# Patient Record
Sex: Female | Born: 1975 | Race: White | Hispanic: No | Marital: Married | Smoking: Never smoker
Health system: Southern US, Community
[De-identification: ages and names within clinical notes are randomized; demographics above are authoritative.]

## PROBLEM LIST (undated history)

## (undated) DIAGNOSIS — N809 Endometriosis, unspecified: Secondary | ICD-10-CM

## (undated) DIAGNOSIS — Z87442 Personal history of urinary calculi: Secondary | ICD-10-CM

## (undated) HISTORY — PX: WISDOM TOOTH EXTRACTION: SHX21

## (undated) HISTORY — PX: OTHER SURGICAL HISTORY: SHX169

## (undated) HISTORY — PX: EYE SURGERY: SHX253

---

## 2007-05-26 ENCOUNTER — Ambulatory Visit (HOSPITAL_COMMUNITY): Admission: RE | Admit: 2007-05-26 | Discharge: 2007-05-26 | Payer: Self-pay | Admitting: Obstetrics and Gynecology

## 2007-08-28 HISTORY — PX: DIAGNOSTIC LAPAROSCOPY: SUR761

## 2007-11-12 ENCOUNTER — Ambulatory Visit (HOSPITAL_COMMUNITY): Admission: RE | Admit: 2007-11-12 | Discharge: 2007-11-12 | Payer: Self-pay | Admitting: Obstetrics and Gynecology

## 2008-11-24 ENCOUNTER — Observation Stay (HOSPITAL_COMMUNITY): Admission: AD | Admit: 2008-11-24 | Discharge: 2008-11-25 | Payer: Self-pay | Admitting: Obstetrics & Gynecology

## 2008-11-25 ENCOUNTER — Encounter (INDEPENDENT_AMBULATORY_CARE_PROVIDER_SITE_OTHER): Payer: Self-pay | Admitting: Obstetrics & Gynecology

## 2009-03-29 ENCOUNTER — Inpatient Hospital Stay (HOSPITAL_COMMUNITY): Admission: AD | Admit: 2009-03-29 | Discharge: 2009-03-31 | Payer: Self-pay | Admitting: Obstetrics and Gynecology

## 2009-06-30 ENCOUNTER — Ambulatory Visit (HOSPITAL_COMMUNITY): Admission: RE | Admit: 2009-06-30 | Discharge: 2009-06-30 | Payer: Self-pay | Admitting: Urology

## 2009-07-28 ENCOUNTER — Ambulatory Visit (HOSPITAL_COMMUNITY): Admission: RE | Admit: 2009-07-28 | Discharge: 2009-07-28 | Payer: Self-pay | Admitting: Urology

## 2010-12-02 LAB — CBC
HCT: 29.8 % — ABNORMAL LOW (ref 36.0–46.0)
MCHC: 35.5 g/dL (ref 30.0–36.0)
MCV: 98.5 fL (ref 78.0–100.0)
Platelets: 142 10*3/uL — ABNORMAL LOW (ref 150–400)
Platelets: 154 10*3/uL (ref 150–400)
RBC: 3.24 MIL/uL — ABNORMAL LOW (ref 3.87–5.11)
RDW: 12.8 % (ref 11.5–15.5)

## 2010-12-02 LAB — RPR: RPR Ser Ql: NONREACTIVE

## 2010-12-06 LAB — STONE ANALYSIS

## 2010-12-07 LAB — DIFFERENTIAL
Lymphocytes Relative: 9 % — ABNORMAL LOW (ref 12–46)
Lymphs Abs: 1 10*3/uL (ref 0.7–4.0)
Monocytes Relative: 3 % (ref 3–12)
Neutro Abs: 9.6 10*3/uL — ABNORMAL HIGH (ref 1.7–7.7)
Neutrophils Relative %: 88 % — ABNORMAL HIGH (ref 43–77)

## 2010-12-07 LAB — COMPREHENSIVE METABOLIC PANEL
BUN: 9 mg/dL (ref 6–23)
Calcium: 9.2 mg/dL (ref 8.4–10.5)
Creatinine, Ser: 0.84 mg/dL (ref 0.4–1.2)
Glucose, Bld: 138 mg/dL — ABNORMAL HIGH (ref 70–99)
Sodium: 138 mEq/L (ref 135–145)
Total Protein: 6 g/dL (ref 6.0–8.3)

## 2010-12-07 LAB — CBC
HCT: 35.2 % — ABNORMAL LOW (ref 36.0–46.0)
Hemoglobin: 11.9 g/dL — ABNORMAL LOW (ref 12.0–15.0)
MCHC: 33.8 g/dL (ref 30.0–36.0)
RDW: 12.9 % (ref 11.5–15.5)

## 2011-01-09 NOTE — Discharge Summary (Signed)
[name], [name]               ACCOUNT NO.:  [id number]   MEDICAL RECORD NO.:  [id number]          PATIENT TYPE:  INP   LOCATION:  9310                          FACILITY:  [facility]   PHYSICIAN:  Maxie Better, M.D.DATE OF BIRTH:  [birth year]   DATE OF ADMISSION:  11/24/2008  DATE OF DISCHARGE:                               DISCHARGE SUMMARY   The patient is a 35 year old gravida 2, para 0 at 26 weeks' gestation  with an EDC of April 15, 2009.  The patient has received prenatal care  at [facility] since 9 weeks' gestation with a primary, Dr. Billy Coast.  Prenatal care has been uncomplicated.  Medical and surgical history are  negative.  The patient has no known drug allergies and takes a prenatal  vitamin once daily.  The patient presents with acute left flank pain  today with nausea and vomiting.  She had no urinary tract infection  symptoms.  No dysuria, no frequency, no urgency, no bleeding.   On presentation, her vital signs were within normal limits with a  temperature of 97.5, pulse 58, respirations 18, a blood pressure of  111/67.  Urinalysis reveals positive hemoglobin.  Urine culture is  pending from [facility] office.  CBC had a white blood cell count  of 11.1, hemoglobin of 11.5, hematocrit of 35.2, and a platelet count of  162.  CMET is within normal limits except for a glucose of 138.  The  patient has no history of diabetes.  We will follow up with fasting  blood sugar during hospitalization.  Sonogram reveals a normal right  kidney, left kidney with a 6 x 8 x 8 average-to-2-mm calculi in the  upper pole.  No evidence of obstruction.  The patient has left CVA  tenderness.  No vaginal bleeding, active fetal movement.  The patient is  admitted for IV hydration and pain management.  The patient received IV  fluid with aggressive hydration with lactated Ringer's.  PCA Dilaudid  for pain management which was effective during hospitalization.  Treated  for nausea x1  with Zofran and Urology Nephrology consult.  On November 25, 2008, in early morning around 1:00 a.m., the patient passed a small  stone-appearing material that was drained from her urine and sent to  Pathology.  Report is pending.  Pain is resolved subsequently after that  episode.  Consult with Urology, Dr. Orvan Falconer.  At this point, the  patient needs no inpatient or office consult due to current status and  pain has resolved.  Larger stone identified on sonogram.  Will need  followup postpartum unless she has any acute recurrence of symptoms.  Expected management at this point through her OB/GYN office and monitor  for pyelectasis in third trimester.  The patient is discharged home on  November 25, 2008, in stable improved condition.  Vital signs are within  normal limits.  The patient has remained afebrile.  Temperature is 98.7,  pulse is 73, respirations 20, blood pressure is 93/62.  Followup glucose  reading of a fasting blood sugar at 86 within normal limits and her pain  is resolved.   Discharge meds include a prenatal vitamin once daily.  Patient cautioned  not to take any additional calcium supplements, Tylox 1-2 p.o. every 4  hours as needed for pain, take only when needed.  The patient to  maintain adequate fluid hydration throughout entire pregnancy.  She has  no activity restrictions.  May return to her normal activity as  tolerated and to follow up at [facility] as scheduled for her next  routine prenatal care visits.  The patient is to call the office with  any acute return of pain, any fever, or urinary tract symptoms.  We will  contact the patient regarding urine culture results from the office when  resulted.  The patient was admitted on November 24, 2008.   ATTENDING PHYSICIAN:  Maxie Better, MD   DIAGNOSES:  19 weeks' gestation, left renal calculi, pain resolved.      Marlinda Mike, C.N.M.      Maxie Better, M.D.  Electronically Signed    TB/MEDQ   D:  11/25/2008  T:  11/25/2008  Job:  [id number]

## 2011-01-09 NOTE — H&P (Signed)
NAMELACRETIA, TINDALL               ACCOUNT NO.:  [id number]   MEDICAL RECORD NO.:  [id number]          PATIENT TYPE:  INP   LOCATION:  9101                          [facility]ACILITY:  [facility]   PHYSICIAN:  Lenoard Aden, M.D.DATE OF BIRTH:  1976/08/04   DATE OF ADMISSION:  03/29/2009  DATE OF DISCHARGE:                              HISTORY & PHYSICAL   CHIEF COMPLAINT:  Labor.   HISTORY OF PRESENT ILLNESS:  She is a 35 year old white female G2, P0 at  [redacted] weeks gestation presents to the office 5 cm dilated in active labor.  She has no known drug allergies.  Her medications are prenatal vitamins.  She is a nonsmoker, nondrinker.  She denies domestic or physical  violence.  She had a prenatal course complicated by LGA and GBS  positivity.  She has family history of hypertension, kidney stones, and  diabetes.  Pregnancy course also was complicated by hospitalization for  urolithiasis.   PHYSICAL EXAMINATION:  GENERAL:  She is a well-developed, well-nourished  white female in no acute distress.  HEENT:  Normal.  LUNGS:  Clear.  HEART:  Regular rate and rhythm.  ABDOMEN:  Soft, gravid, nontender.  Estimated fetal weight 8 pounds.  Cervix 5, 80% vertex, 0 station.  EXTREMITIES:  There are no cords.  NEUROLOGIC:  Nonfocal.  SKIN:  Intact.   IMPRESSION:  Term intrauterine pregnancy and active labor.  GBS  positive.   PLAN:  Ampicillin prophylaxis, epidural.  Anticipate attempts at vaginal  delivery.      Lenoard Aden, M.D.  Electronically Signed     RJT/MEDQ  D:  03/29/2009  T:  03/30/2009  Job:  [id number]

## 2011-01-09 NOTE — Op Note (Signed)
[name] [name], [name] [name]               ACCOUNT NO.:  [id number]   MEDICAL RECORD NO.:  [id number]          PATIENT TYPE:  AMB   LOCATION:  SDC                           [facility]ILITY:  [facility]   PHYSICIAN:  Lenoard Aden, M.D.DATE OF BIRTH:  Sep 12, 1975   DATE OF PROCEDURE:  11/12/2007  DATE OF DISCHARGE:                               OPERATIVE REPORT   PREOPERATIVE DIAGNOSES:  1. Pelvic pain.  2. Dysmenorrhea.   POSTOPERATIVE DIAGNOSES:  1. Pelvic adhesions.  2. Pelvic endometriosis.  3. Left ovarian endometrioma.   PROCEDURES:  1. Diagnostic laparoscopy.  2. Lysis of adhesions.  3. Ablation of pelvic endometriosis.  4. Excision of left ovarian endometrioma.   SURGEON:  Lenoard Aden, M.D.   ANESTHESIA:  General.   ESTIMATED BLOOD LOSS:  Less than 50 mL.   COMPLICATIONS:  None.   DRAINS:  None.   COUNTS:  Correct.   Patient to recovery in good condition.   DESCRIPTION OF PROCEDURE:  After being apprised of the risks of  anesthesia, infection, bleeding, injury to abdominal organs with need  for repair, delayed versus immediate complications to include bowel and  bladder injury with inability to cure pelvic pain, the patient brought  to the operating room where she was administered a general anesthetic  without complications, prepped and draped in usual sterile fashion,  catheterized until the bladder was empty.  Hulka tenaculum placed per  vagina.  Infraumbilical incision made with a scalpel.  Feet having been  placed in the yellow fin stirrups.  Dilute Marcaine solution placed in  the umbilicus. After achieving a small stab incision, a Veress needle is  placed, opening pressure -2 noted, 4 liters CO2 insufflated without  difficulty.  Trocar placed atraumatically.  Pictures taken.  Normal  liver and gallbladder bed noted.  Normal appendiceal area.  There are  some adhesions from the sigmoid colon to the left pelvic sidewall.  There are adhesions in the cul-de-sac  specifically related to the left  ovary into the ovarian fossa.  Multiple implants of endometriosis noted  posteriorly and along the left and right pelvic sidewall.  Two 5 mm  trocar sites are placed atraumatically under direct visualization and  the Kleppinger bipolar cautery is entered ablating all evidence of  endometriosis along the left and right pelvic side walls and in the  posterior cul-de-sac, attention having been paid to the location of the  ureter during this process.  At this time, the right ovary is elevated  using atraumatic grasper and sharply excised from the ovarian fossa  whereby an endometrioma is entered that is adhering this lifted ovary to  the left pelvic sidewall.  This endometrioma is excised.  Ovary is  opened.  Endometrioma is drained.  The cyst wall and is cauterized and  excised.  The ovary is then elevated and the defect from where the left  endometrioma is noted appears to be hemostatic.  There are multiple  superficial implants of endometriosis along the left ovary which are  also ablated without difficulty.  Minimal bleeding is noted.  The pelvic  sidewall then  is noted to have some areas consistent with endometriosis  which are ablated using the Kleppinger bipolar cautery as well.  Superficial implants along the right ovary are cauterized as well.  Good  hemostasis is noted.  CO2 is released.  Irrigation is accomplished.  Replacement of the CO2 reveals good hemostasis.  All irrigation fluid is  removed under direct visualization.  CO2 is re-released.  Good  hemostasis is confirmed.  Bilateral normal tubes are noted.  Normal  anterior cul-de-sac is noted.  Previously normal HSG is noted.  At this  time, CO2 is released.  All instruments are removed under direct  visualization.  Incisions are closed using a zero Vicryl, 4-0 Vicryl and  Dermabond.  Instruments are removed from the vagina.  The patient is  awakened and transferred to recovery in good  condition.      Lenoard Aden, M.D.  Electronically Signed     RJT/MEDQ  D:  11/12/2007  T:  11/12/2007  Job:  [id number]

## 2011-05-21 LAB — CBC
Hemoglobin: 13.3
MCHC: 35.1
MCV: 92.4
RBC: 4.1

## 2014-07-09 ENCOUNTER — Other Ambulatory Visit: Payer: Self-pay | Admitting: Obstetrics and Gynecology

## 2014-07-13 NOTE — Patient Instructions (Addendum)
   Your procedure is scheduled on: Monday, Nov 23  [facility] at: 12:30 PM Pick up the phone at the desk and dial [phone] and inform us of your arrival.  Please call this number if you have any problems the morning of surgery: [phone]  Remember: Do not eat food after midnight: Sunday Do not drink clear liquids after: 9:30 AM Monday, day of surgery Take these medicines the morning of surgery with a SIP OF WATER:  None  Do not wear jewelry, make-up, or FINGER nail polish No metal in your hair or on your body. Do not wear lotions, powders, perfumes.  You may wear deodorant.  Do not bring valuables to the hospital. Contacts, dentures or bridgework may not be worn into surgery.  Leave suitcase in the car. After Surgery it may be brought to your room. For patients being admitted to the hospital, checkout time is 11:00am the day of discharge.  Home with mother [name] [name] cell [phone]

## 2014-07-15 ENCOUNTER — Encounter (HOSPITAL_COMMUNITY)
Admission: RE | Admit: 2014-07-15 | Discharge: 2014-07-15 | Disposition: A | Discharge status: Home / Self Care | Payer: Managed Care, Other (non HMO) | Source: Ambulatory Visit | Attending: Obstetrics and Gynecology | Admitting: Obstetrics and Gynecology

## 2014-07-15 ENCOUNTER — Encounter (HOSPITAL_COMMUNITY): Payer: Self-pay

## 2014-07-15 DIAGNOSIS — Z01812 Encounter for preprocedural laboratory examination: Secondary | ICD-10-CM | POA: Diagnosis present

## 2014-07-15 HISTORY — DX: Endometriosis, unspecified: N80.9

## 2014-07-15 HISTORY — DX: Personal history of urinary calculi: Z87.442

## 2014-07-15 LAB — CBC
HCT: 36 % (ref 36.0–46.0)
Hemoglobin: 12.1 g/dL (ref 12.0–15.0)
MCH: 30.6 pg (ref 26.0–34.0)
MCHC: 33.6 g/dL (ref 30.0–36.0)
MCV: 91.1 fL (ref 78.0–100.0)
PLATELETS: 198 10*3/uL (ref 150–400)
RBC: 3.95 MIL/uL (ref 3.87–5.11)
RDW: 13.5 % (ref 11.5–15.5)
WBC: 5.3 10*3/uL (ref 4.0–10.5)

## 2014-07-15 LAB — COMPREHENSIVE METABOLIC PANEL
ALK PHOS: 52 U/L (ref 39–117)
ALT: 13 U/L (ref 0–35)
AST: 15 U/L (ref 0–37)
Albumin: 3.9 g/dL (ref 3.5–5.2)
Anion gap: 10 (ref 5–15)
BUN: 17 mg/dL (ref 6–23)
CALCIUM: 9 mg/dL (ref 8.4–10.5)
CO2: 29 meq/L (ref 19–32)
Chloride: 100 mEq/L (ref 96–112)
Creatinine, Ser: 0.76 mg/dL (ref 0.50–1.10)
GFR calc Af Amer: >90 mL/min (ref >90)
Glucose, Bld: 134 mg/dL — ABNORMAL HIGH (ref 70–99)
POTASSIUM: 3.9 meq/L (ref 3.7–5.3)
SODIUM: 139 meq/L (ref 137–147)
Total Bilirubin: 1.2 mg/dL (ref 0.3–1.2)
Total Protein: 6.8 g/dL (ref 6.0–8.3)

## 2014-07-18 NOTE — H&P (Signed)
NAMRutherford Nail:  [name], [name]               ACCOUNT NO.:  [id number]  MEDICAL RECORD NO.:  [id number]  LOCATION:  PERIO                         [facility]ACILITY:  [facility]  PHYSICIAN:  Lenoard Adenichard J. Sokha Craker, M.D.DATE OF BIRTH:  [birth year]  DATE OF ADMISSION:  06/10/2014 DATE OF DISCHARGE:                             HISTORY & PHYSICAL   CHIEF COMPLAINT:  Pelvic pain, dysmenorrhea, menorrhagia with history of endometriosis.  HISTORY OF PRESENT ILLNESS:  A [age] year old white female G1, P1, presents for definitive therapy of recurrent endometriosis, pelvic pain, and dysmenorrhea.  ALLERGIES:  She has no known drug allergies.  MEDICATIONS:  Multivitamin.  FAMILY HISTORY:  Hypertension, colon cancer, diabetes, and kidney stone.  SOCIAL HISTORY:  Noncontributory.  PREGNANCY HISTORY:  One vaginal delivery and one miscarriage.  PHYSICAL EXAMINATION:  GENERAL:  A well-developed, well-nourished, white female, in no acute distress. HEENT:  Normal. NECK:  Supple.  Full range of motion. LUNGS:  Clear. HEART:  Regular rate and rhythm. ABDOMEN:  Soft, nontender. PELVIC: tender , normal sized,  anteflexed uterus and bilateral adnexal tenderness.  Ultrasound is questionably consistent with bilateral endometriomas. EXTREMITIES:  No cords. NEUROLOGIC:  Nonfocal. SKIN:  Intact.  IMPRESSION: 1. Symptomatic pelvic pain with dysmenorrhea and menorrhagia. 2. History of pelvic endometriosis.  PLAN:  Proceed with daVinci assisted total laparoscopic hysterectomy, bilateral salpingectomy,  Excision vs ablative treatment of endometriosis.  Risks of anesthesia, infection, bleeding, injury to surrounding organs with possible need for repair was discussed, delayed versus immediate complications to include bowel and bladder injury are noted.  The patient acknowledges and wishes to proceed.     Lenoard Adenichard J. Benton Tooker, M.D.     RJT/MEDQ  D:  07/18/2014  T:  07/18/2014  Job:  [id number]

## 2014-07-19 ENCOUNTER — Ambulatory Visit (HOSPITAL_COMMUNITY): Payer: Managed Care, Other (non HMO) | Admitting: Certified Registered Nurse Anesthetist

## 2014-07-19 ENCOUNTER — Encounter (HOSPITAL_COMMUNITY)
Admission: RE | Disposition: A | Discharge status: Home / Self Care | Payer: Self-pay | Source: Ambulatory Visit | Attending: Obstetrics and Gynecology

## 2014-07-19 ENCOUNTER — Observation Stay (HOSPITAL_COMMUNITY)
Admission: RE | Admit: 2014-07-19 | Discharge: 2014-07-20 | Disposition: A | Discharge status: Home / Self Care | Payer: Managed Care, Other (non HMO) | Source: Ambulatory Visit | Attending: Obstetrics and Gynecology | Admitting: Obstetrics and Gynecology

## 2014-07-19 ENCOUNTER — Encounter (HOSPITAL_COMMUNITY): Payer: Self-pay | Admitting: *Deleted

## 2014-07-19 ENCOUNTER — Ambulatory Visit (HOSPITAL_COMMUNITY): Payer: Managed Care, Other (non HMO) | Admitting: Anesthesiology

## 2014-07-19 DIAGNOSIS — N809 Endometriosis, unspecified: Secondary | ICD-10-CM | POA: Diagnosis present

## 2014-07-19 DIAGNOSIS — N736 Female pelvic peritoneal adhesions (postinfective): Secondary | ICD-10-CM | POA: Insufficient documentation

## 2014-07-19 DIAGNOSIS — N803 Endometriosis of pelvic peritoneum: Secondary | ICD-10-CM | POA: Diagnosis not present

## 2014-07-19 DIAGNOSIS — N838 Other noninflammatory disorders of ovary, fallopian tube and broad ligament: Principal | ICD-10-CM | POA: Insufficient documentation

## 2014-07-19 DIAGNOSIS — N920 Excessive and frequent menstruation with regular cycle: Secondary | ICD-10-CM | POA: Diagnosis not present

## 2014-07-19 DIAGNOSIS — N946 Dysmenorrhea, unspecified: Secondary | ICD-10-CM | POA: Diagnosis present

## 2014-07-19 HISTORY — PX: ROBOTIC ASSISTED TOTAL HYSTERECTOMY: SHX6085

## 2014-07-19 LAB — HCG, SERUM, QUALITATIVE: Preg, Serum: NEGATIVE

## 2014-07-19 SURGERY — ROBOTIC ASSISTED TOTAL HYSTERECTOMY
Anesthesia: General | Site: Abdomen | Laterality: Bilateral

## 2014-07-19 MED ORDER — HYDROMORPHONE HCL 1 MG/ML IJ SOLN: 0.2500 mg | INTRAMUSCULAR | Status: DC | PRN

## 2014-07-19 MED ORDER — SCOPOLAMINE 1 MG/3DAYS TD PT72
MEDICATED_PATCH | TRANSDERMAL | Status: AC
  Administered 2014-07-19: 1.5 mg via TRANSDERMAL
  Filled 2014-07-19: qty 1

## 2014-07-19 MED ORDER — SODIUM CHLORIDE 0.9 % IV SOLN
INTRAVENOUS | Status: DC | PRN
  Administered 2014-07-19: 76 mL

## 2014-07-19 MED ORDER — LIDOCAINE HCL (CARDIAC) 20 MG/ML IV SOLN
INTRAVENOUS | Status: DC | PRN
  Administered 2014-07-19: 80 mg via INTRAVENOUS

## 2014-07-19 MED ORDER — GLYCOPYRROLATE 0.2 MG/ML IJ SOLN
INTRAMUSCULAR | Status: DC | PRN
  Administered 2014-07-19: 0.2 mg via INTRAVENOUS
  Administered 2014-07-19: .5 mg via INTRAVENOUS

## 2014-07-19 MED ORDER — LIDOCAINE HCL (CARDIAC) 20 MG/ML IV SOLN
INTRAVENOUS | Status: AC
  Filled 2014-07-19: qty 5

## 2014-07-19 MED ORDER — METOCLOPRAMIDE HCL 5 MG/ML IJ SOLN: 10.0000 mg | Freq: Once | INTRAMUSCULAR | Status: DC | PRN

## 2014-07-19 MED ORDER — FENTANYL CITRATE 0.05 MG/ML IJ SOLN: 25.0000 ug | INTRAMUSCULAR | Status: DC | PRN

## 2014-07-19 MED ORDER — PROPOFOL 10 MG/ML IV EMUL
INTRAVENOUS | Status: AC
  Filled 2014-07-19: qty 100

## 2014-07-19 MED ORDER — DEXAMETHASONE SODIUM PHOSPHATE 10 MG/ML IJ SOLN
INTRAMUSCULAR | Status: DC | PRN
  Administered 2014-07-19: 4 mg via INTRAVENOUS

## 2014-07-19 MED ORDER — MIDAZOLAM HCL 2 MG/2ML IJ SOLN
INTRAMUSCULAR | Status: DC | PRN
  Administered 2014-07-19: 2 mg via INTRAVENOUS

## 2014-07-19 MED ORDER — ONDANSETRON HCL 4 MG/2ML IJ SOLN
INTRAMUSCULAR | Status: AC
  Filled 2014-07-19: qty 2

## 2014-07-19 MED ORDER — TRAMADOL HCL 50 MG PO TABS: 50.0000 mg | ORAL_TABLET | Freq: Four times a day (QID) | ORAL | Status: DC | PRN

## 2014-07-19 MED ORDER — ROCURONIUM BROMIDE 100 MG/10ML IV SOLN
INTRAVENOUS | Status: AC
  Filled 2014-07-19: qty 1

## 2014-07-19 MED ORDER — CEFAZOLIN SODIUM-DEXTROSE 2-3 GM-% IV SOLR
INTRAVENOUS | Status: AC
  Filled 2014-07-19: qty 50

## 2014-07-19 MED ORDER — OXYCODONE-ACETAMINOPHEN 5-325 MG PO TABS
1.0000 | ORAL_TABLET | ORAL | Status: DC | PRN
  Administered 2014-07-20: 1 via ORAL
  Filled 2014-07-19: qty 1

## 2014-07-19 MED ORDER — LACTATED RINGERS IR SOLN
Status: DC | PRN
  Administered 2014-07-19: 3000 mL

## 2014-07-19 MED ORDER — SODIUM CHLORIDE 0.9 % IJ SOLN: 9.0000 mL | INTRAMUSCULAR | Status: DC | PRN

## 2014-07-19 MED ORDER — EPHEDRINE SULFATE 50 MG/ML IJ SOLN
INTRAMUSCULAR | Status: DC | PRN
  Administered 2014-07-19 (×2): 5 mg via INTRAVENOUS

## 2014-07-19 MED ORDER — DIPHENHYDRAMINE HCL 12.5 MG/5ML PO ELIX: 12.5000 mg | ORAL_SOLUTION | Freq: Four times a day (QID) | ORAL | Status: DC | PRN

## 2014-07-19 MED ORDER — ROPIVACAINE HCL 5 MG/ML IJ SOLN
INTRAMUSCULAR | Status: AC
  Filled 2014-07-19: qty 60

## 2014-07-19 MED ORDER — HYDROMORPHONE 0.3 MG/ML IV SOLN
INTRAVENOUS | Status: DC
  Administered 2014-07-19: 0.999 mg via INTRAVENOUS
  Administered 2014-07-19: 18:00:00 via INTRAVENOUS
  Administered 2014-07-20: 0.6 mg via INTRAVENOUS
  Administered 2014-07-20: 0.399 mg via INTRAVENOUS
  Administered 2014-07-20: 1.39 mg via INTRAVENOUS
  Filled 2014-07-19: qty 25

## 2014-07-19 MED ORDER — ROCURONIUM BROMIDE 100 MG/10ML IV SOLN
INTRAVENOUS | Status: DC | PRN
  Administered 2014-07-19 (×2): 10 mg via INTRAVENOUS
  Administered 2014-07-19: 50 mg via INTRAVENOUS

## 2014-07-19 MED ORDER — FENTANYL CITRATE 0.05 MG/ML IJ SOLN
INTRAMUSCULAR | Status: AC
  Filled 2014-07-19: qty 2

## 2014-07-19 MED ORDER — NALOXONE HCL 0.4 MG/ML IJ SOLN: 0.4000 mg | INTRAMUSCULAR | Status: DC | PRN

## 2014-07-19 MED ORDER — KETOROLAC TROMETHAMINE 30 MG/ML IJ SOLN
INTRAMUSCULAR | Status: AC
  Filled 2014-07-19: qty 1

## 2014-07-19 MED ORDER — FENTANYL CITRATE 0.05 MG/ML IJ SOLN
INTRAMUSCULAR | Status: DC | PRN
  Administered 2014-07-19 (×7): 50 ug via INTRAVENOUS

## 2014-07-19 MED ORDER — MIDAZOLAM HCL 2 MG/2ML IJ SOLN
INTRAMUSCULAR | Status: AC
  Filled 2014-07-19: qty 2

## 2014-07-19 MED ORDER — DIPHENHYDRAMINE HCL 50 MG/ML IJ SOLN: 12.5000 mg | Freq: Four times a day (QID) | INTRAMUSCULAR | Status: DC | PRN

## 2014-07-19 MED ORDER — ARTIFICIAL TEARS OP OINT
TOPICAL_OINTMENT | OPHTHALMIC | Status: AC
  Filled 2014-07-19: qty 3.5

## 2014-07-19 MED ORDER — EPHEDRINE 5 MG/ML INJ
INTRAVENOUS | Status: AC
Start: 2014-07-19 — End: 2014-07-19
  Filled 2014-07-19: qty 10

## 2014-07-19 MED ORDER — PROPOFOL 10 MG/ML IV EMUL
INTRAVENOUS | Status: AC
  Filled 2014-07-19: qty 20

## 2014-07-19 MED ORDER — SODIUM CHLORIDE 0.9 % IJ SOLN
INTRAMUSCULAR | Status: AC
  Filled 2014-07-19: qty 50

## 2014-07-19 MED ORDER — NEOSTIGMINE METHYLSULFATE 10 MG/10ML IV SOLN
INTRAVENOUS | Status: DC | PRN
  Administered 2014-07-19: 3 mg via INTRAVENOUS

## 2014-07-19 MED ORDER — MEPERIDINE HCL 25 MG/ML IJ SOLN: 6.2500 mg | INTRAMUSCULAR | Status: DC | PRN

## 2014-07-19 MED ORDER — KETOROLAC TROMETHAMINE 30 MG/ML IJ SOLN
INTRAMUSCULAR | Status: DC | PRN
Start: 2014-07-19 — End: 2014-07-19
  Administered 2014-07-19: 30 mg via INTRAVENOUS

## 2014-07-19 MED ORDER — FENTANYL CITRATE 0.05 MG/ML IJ SOLN
INTRAMUSCULAR | Status: AC
  Filled 2014-07-19: qty 5

## 2014-07-19 MED ORDER — HYDROMORPHONE HCL 1 MG/ML IJ SOLN
INTRAMUSCULAR | Status: AC
  Filled 2014-07-19: qty 1

## 2014-07-19 MED ORDER — CEFAZOLIN SODIUM-DEXTROSE 2-3 GM-% IV SOLR
2.0000 g | INTRAVENOUS | Status: AC
  Administered 2014-07-19: 2 g via INTRAVENOUS

## 2014-07-19 MED ORDER — SODIUM CHLORIDE 0.9 % IJ SOLN
INTRAMUSCULAR | Status: AC
  Filled 2014-07-19: qty 10

## 2014-07-19 MED ORDER — HYDROMORPHONE HCL 1 MG/ML IJ SOLN
INTRAMUSCULAR | Status: DC | PRN
  Administered 2014-07-19: 1 mg via INTRAVENOUS

## 2014-07-19 MED ORDER — DEXAMETHASONE SODIUM PHOSPHATE 4 MG/ML IJ SOLN
INTRAMUSCULAR | Status: AC
  Filled 2014-07-19: qty 1

## 2014-07-19 MED ORDER — LACTATED RINGERS IV SOLN
INTRAVENOUS | Status: DC
  Administered 2014-07-19 (×2): via INTRAVENOUS

## 2014-07-19 MED ORDER — SCOPOLAMINE 1 MG/3DAYS TD PT72
1.0000 | MEDICATED_PATCH | Freq: Once | TRANSDERMAL | Status: DC
  Administered 2014-07-19: 1.5 mg via TRANSDERMAL

## 2014-07-19 MED ORDER — DEXTROSE IN LACTATED RINGERS 5 % IV SOLN
INTRAVENOUS | Status: DC
  Administered 2014-07-19 – 2014-07-20 (×2): via INTRAVENOUS

## 2014-07-19 MED ORDER — ONDANSETRON HCL 4 MG/2ML IJ SOLN
INTRAMUSCULAR | Status: DC | PRN
  Administered 2014-07-19: 4 mg via INTRAVENOUS

## 2014-07-19 MED ORDER — PROPOFOL 10 MG/ML IV BOLUS
INTRAVENOUS | Status: DC | PRN
  Administered 2014-07-19: 160 mg via INTRAVENOUS

## 2014-07-19 MED ORDER — ONDANSETRON HCL 4 MG/2ML IJ SOLN: 4.0000 mg | Freq: Four times a day (QID) | INTRAMUSCULAR | Status: DC | PRN

## 2014-07-19 MED ORDER — GLYCOPYRROLATE 0.2 MG/ML IJ SOLN
INTRAMUSCULAR | Status: AC
  Filled 2014-07-19: qty 1

## 2014-07-19 SURGICAL SUPPLY — 53 items
BARRIER ADHS 3X4 INTERCEED (GAUZE/BANDAGES/DRESSINGS) ×4 IMPLANT
CATH FOLEY 3WAY  5CC 16FR (CATHETERS) ×1
CATH FOLEY 3WAY 5CC 16FR (CATHETERS) ×1 IMPLANT
CLOTH BEACON ORANGE TIMEOUT ST (SAFETY) ×2 IMPLANT
CONT PATH 16OZ SNAP LID 3702 (MISCELLANEOUS) ×2 IMPLANT
COVER BACK TABLE 60X90IN (DRAPES) ×4 IMPLANT
COVER TIP SHEARS 8 DVNC (MISCELLANEOUS) ×1 IMPLANT
COVER TIP SHEARS 8MM DA VINCI (MISCELLANEOUS) ×1
DECANTER SPIKE VIAL GLASS SM (MISCELLANEOUS) ×8 IMPLANT
DRAPE HUG U DISPOSABLE (DRAPE) ×2 IMPLANT
DRAPE WARM FLUID 44X44 (DRAPE) ×2 IMPLANT
DURAPREP 26ML APPLICATOR (WOUND CARE) ×2 IMPLANT
ELECT REM PT RETURN 9FT ADLT (ELECTROSURGICAL) ×2
ELECTRODE REM PT RTRN 9FT ADLT (ELECTROSURGICAL) ×1 IMPLANT
EVACUATOR SMOKE 8.L (FILTER) ×2 IMPLANT
GAUZE VASELINE 3X9 (GAUZE/BANDAGES/DRESSINGS) IMPLANT
GLOVE BIO SURGEON STRL SZ7.5 (GLOVE) ×4 IMPLANT
GLOVE BIOGEL PI IND STRL 7.0 (GLOVE) ×4 IMPLANT
GLOVE BIOGEL PI INDICATOR 7.0 (GLOVE) ×4
GOWN STRL REUS W/TWL LRG LVL3 (GOWN DISPOSABLE) ×20 IMPLANT
KIT ACCESSORY DA VINCI DISP (KITS) ×1
KIT ACCESSORY DVNC DISP (KITS) ×1 IMPLANT
LEGGING LITHOTOMY PAIR STRL (DRAPES) ×2 IMPLANT
LIQUID BAND (GAUZE/BANDAGES/DRESSINGS) ×2 IMPLANT
NEEDLE INSUFFLATION 150MM (ENDOMECHANICALS) ×2 IMPLANT
OCCLUDER COLPOPNEUMO (BALLOONS) ×4 IMPLANT
PACK ROBOT WH (CUSTOM PROCEDURE TRAY) ×2 IMPLANT
PAD PREP 24X48 CUFFED NSTRL (MISCELLANEOUS) ×4 IMPLANT
PAD TRENDELENBURG POSITION (MISCELLANEOUS) ×2 IMPLANT
PROTECTOR NERVE ULNAR (MISCELLANEOUS) ×8 IMPLANT
SET CYSTO W/LG BORE CLAMP LF (SET/KITS/TRAYS/PACK) ×2 IMPLANT
SET IRRIG TUBING LAPAROSCOPIC (IRRIGATION / IRRIGATOR) ×2 IMPLANT
SUT VIC AB 0 CT1 27 (SUTURE) ×2
SUT VIC AB 0 CT1 27XBRD ANBCTR (SUTURE) ×2 IMPLANT
SUT VICRYL 0 UR6 27IN ABS (SUTURE) ×2 IMPLANT
SUT VICRYL RAPIDE 4/0 PS 2 (SUTURE) ×4 IMPLANT
SUT VLOC 180 0 9IN  GS21 (SUTURE) ×1
SUT VLOC 180 0 9IN GS21 (SUTURE) ×1 IMPLANT
SYR 50ML LL SCALE MARK (SYRINGE) ×2 IMPLANT
SYRINGE 10CC LL (SYRINGE) ×2 IMPLANT
TIP RUMI ORANGE 6.7MMX12CM (TIP) ×2 IMPLANT
TIP UTERINE 5.1X6CM LAV DISP (MISCELLANEOUS) IMPLANT
TIP UTERINE 6.7X10CM GRN DISP (MISCELLANEOUS) ×2 IMPLANT
TIP UTERINE 6.7X6CM WHT DISP (MISCELLANEOUS) IMPLANT
TIP UTERINE 6.7X8CM BLUE DISP (MISCELLANEOUS) IMPLANT
TOWEL OR 17X24 6PK STRL BLUE (TOWEL DISPOSABLE) ×6 IMPLANT
TROCAR DISP BLADELESS 8 DVNC (TROCAR) ×1 IMPLANT
TROCAR DISP BLADELESS 8MM (TROCAR) ×1
TROCAR XCEL 12X100 BLDLESS (ENDOMECHANICALS) IMPLANT
TROCAR XCEL NON-BLD 5MMX100MML (ENDOMECHANICALS) ×2 IMPLANT
TROCAR Z-THREAD 12X150 (TROCAR) ×2 IMPLANT
WARMER LAPAROSCOPE (MISCELLANEOUS) ×2 IMPLANT
WATER STERILE IRR 1000ML POUR (IV SOLUTION) ×6 IMPLANT

## 2014-07-19 NOTE — Plan of Care (Signed)
Problem: Phase I Progression Outcomes Goal: Pain controlled with appropriate interventions Outcome: Completed/Met Date Met:  07/19/14 Goal: Admission history reviewed Outcome: Completed/Met Date Met:  07/19/14 Goal: Dangle/OOB as tolerated per MD order Outcome: Completed/Met Date Met:  07/19/14 Goal: VS, stable, temp < 100.4 degrees F Outcome: Completed/Met Date Met:  07/19/14

## 2014-07-19 NOTE — Op Note (Signed)
07/19/2014  4:12 PM  PATIENT:  [name] [name]  38 y.o. female  PRE-OPERATIVE DIAGNOSIS:  Dysmenorrhea, History of Endometriosis  POST-OPERATIVE DIAGNOSIS:  Dysmenorrhea, History of Endometriosis, right endometrioma, cul de sac endometriosis  PROCEDURE:  Procedure(s): ROBOTIC ASSISTED TOTAL HYSTERECTOMY  ABLATION /EXCISION OF ENDOMETRIOSIS, LYSIS OF ADHESIONS RIGHT OVARIAN CYSTECTOMY BILATERAL SALPINGECTOMY MCCALL CUL DE PLASTY  SURGEON:  Surgeon(s): Lenoard Adenichard J Rithy Mandley, MD  ASSISTANTS: DAWSON, CNM   ANESTHESIA:   local and general  ESTIMATED BLOOD LOSS: minimal  DRAINS: Urinary Catheter (Foley)   LOCAL MEDICATIONS USED:  LIDOCAINE  and Amount: 60 ml  SPECIMEN:  Source of Specimen:  uterus, cervix, bilateral tubes, ovarian cyst wall  DISPOSITION OF SPECIMEN:  PATHOLOGY  COUNTS:  YES  DICTATION #: [id number]  PLAN OF CARE: dc home  PATIENT DISPOSITION:  PACU - hemodynamically stable.

## 2014-07-19 NOTE — Progress Notes (Signed)
Patient ID: [name] [name], female   DOB: [birth year], 38 y.o.   MRN: [id number] Patient seen and examined. Consent witnessed and signed. No changes noted. Update completed. CBC    Component Value Date/Time   WBC 5.3 07/15/2014 1447   RBC 3.95 07/15/2014 1447   HGB 12.1 07/15/2014 1447   HCT 36.0 07/15/2014 1447   PLT 198 07/15/2014 1447   MCV 91.1 07/15/2014 1447   MCH 30.6 07/15/2014 1447   MCHC 33.6 07/15/2014 1447   RDW 13.5 07/15/2014 1447   LYMPHSABS 1.0 11/24/2008 1140   MONOABS 0.3 11/24/2008 1140   EOSABS 0.0 11/24/2008 1140   BASOSABS 0.0 11/24/2008 1140

## 2014-07-19 NOTE — Anesthesia Postprocedure Evaluation (Signed)
Anesthesia Post Note  Patient: [name] [name]  Procedure(s) Performed: Procedure(s) (LRB): ROBOTIC ASSISTED TOTAL HYSTERECTOMY WITH ABLATION /EXCISION OF ENDOMETRIOSIS, BILATERAL SALPINGECTOMY (Bilateral)  Anesthesia type: General  Patient location: PACU  Post pain: Pain level controlled  Post assessment: Post-op Vital signs reviewed  Last Vitals:  Filed Vitals:   07/19/14 1730  BP: 100/57  Pulse: 73  Temp:   Resp: 18    Post vital signs: Reviewed  Level of consciousness: sedated  Complications: No apparent anesthesia complications

## 2014-07-19 NOTE — Transfer of Care (Signed)
Immediate Anesthesia Transfer of Care Note  Patient: [name] [name]  Procedure(s) Performed: Procedure(s): ROBOTIC ASSISTED TOTAL HYSTERECTOMY WITH ABLATION /EXCISION OF ENDOMETRIOSIS, BILATERAL SALPINGECTOMY (Bilateral)  Patient Location: PACU  Anesthesia Type:General  Level of Consciousness: awake, alert  and oriented  Airway & Oxygen Therapy: Patient Spontanous Breathing and Patient connected to nasal cannula oxygen  Post-op Assessment: Report given to PACU RN, Post -op Vital signs reviewed and stable and Patient moving all extremities X 4  Post vital signs: Reviewed and stable  Complications: No apparent anesthesia complications

## 2014-07-19 NOTE — Anesthesia Preprocedure Evaluation (Signed)
Anesthesia Evaluation  Patient identified by MRN, date of birth, ID band Patient awake    Reviewed: Allergy & Precautions, H&P , NPO status , Patient's Chart, lab work & pertinent test results  Airway Mallampati: II  TM Distance: >3 FB Neck ROM: Full    Dental no notable dental hx. (+) Teeth Intact   Pulmonary neg pulmonary ROS,  breath sounds clear to auscultation  Pulmonary exam normal       Cardiovascular negative cardio ROS  Rhythm:Regular Rate:Normal     Neuro/Psych negative neurological ROS  negative psych ROS   GI/Hepatic Neg liver ROS,   Endo/Other  negative endocrine ROS  Renal/GU Hx/o Renal Calculi  negative genitourinary   Musculoskeletal negative musculoskeletal ROS (+)   Abdominal   Peds  Hematology negative hematology ROS (+)   Anesthesia Other Findings   Reproductive/Obstetrics Hx/o Endometriosis Dysmenorrhea                              Anesthesia Physical Anesthesia Plan  ASA: II  Anesthesia Plan:    Post-op Pain Management:    Induction:   Airway Management Planned: Oral ETT  Additional Equipment:   Intra-op Plan:   Post-operative Plan: Extubation in OR  Informed Consent: I have reviewed the patients History and Physical, chart, labs and discussed the procedure including the risks, benefits and alternatives for the proposed anesthesia with the patient or authorized representative who has indicated his/her understanding and acceptance.   Dental advisory given  Plan Discussed with: CRNA, Anesthesiologist and Surgeon  Anesthesia Plan Comments:         Anesthesia Quick Evaluation

## 2014-07-19 NOTE — OR Nursing (Signed)
At 1430 Dr Billy Coastaavon made aware robotic arms causing compression to patient bilateral arms. A request was made to Dr Billy Coastaavon to change position of robotic arms to prevent injury to bilateral arms. No change made. Dr Arby BarretteHatchett called at 1432. Dr Arby BarretteHatchett assisted with placement of extra padding to bilateral patient arms. Dr Arby BarretteHatchett observed Movement of robotic arms and stated that is the best we can do. Dr Billy Coastaavon proceeded with surgery.

## 2014-07-19 NOTE — Plan of Care (Signed)
Problem: Phase I Progression Outcomes Goal: I & O every 4 hrs or as ordered Outcome: Completed/Met Date Met:  07/19/14 Goal: IS, TCDB as ordered Outcome: Completed/Met Date Met:  07/19/14

## 2014-07-20 ENCOUNTER — Encounter (HOSPITAL_COMMUNITY): Payer: Self-pay | Admitting: Obstetrics and Gynecology

## 2014-07-20 DIAGNOSIS — N838 Other noninflammatory disorders of ovary, fallopian tube and broad ligament: Secondary | ICD-10-CM | POA: Diagnosis not present

## 2014-07-20 LAB — BASIC METABOLIC PANEL
Anion gap: 6 (ref 5–15)
BUN: 9 mg/dL (ref 6–23)
CALCIUM: 8.2 mg/dL — AB (ref 8.4–10.5)
CO2: 29 meq/L (ref 19–32)
Chloride: 103 mEq/L (ref 96–112)
Creatinine, Ser: 0.75 mg/dL (ref 0.50–1.10)
GFR calc Af Amer: >90 mL/min (ref >90)
GFR calc non Af Amer: >90 mL/min (ref >90)
GLUCOSE: 162 mg/dL — AB (ref 70–99)
Potassium: 3.9 mEq/L (ref 3.7–5.3)
SODIUM: 138 meq/L (ref 137–147)

## 2014-07-20 LAB — CBC
HCT: 29.7 % — ABNORMAL LOW (ref 36.0–46.0)
Hemoglobin: 10.2 g/dL — ABNORMAL LOW (ref 12.0–15.0)
MCH: 31.1 pg (ref 26.0–34.0)
MCHC: 34.3 g/dL (ref 30.0–36.0)
MCV: 90.5 fL (ref 78.0–100.0)
Platelets: 180 10*3/uL (ref 150–400)
RBC: 3.28 MIL/uL — ABNORMAL LOW (ref 3.87–5.11)
RDW: 13.3 % (ref 11.5–15.5)
WBC: 7.1 10*3/uL (ref 4.0–10.5)

## 2014-07-20 MED ORDER — TRAMADOL HCL 50 MG PO TABS: 50.0000 mg | ORAL_TABLET | Freq: Four times a day (QID) | ORAL | Status: AC | PRN

## 2014-07-20 MED ORDER — OXYCODONE-ACETAMINOPHEN 5-325 MG PO TABS: 1.0000 | ORAL_TABLET | ORAL | Status: AC | PRN

## 2014-07-20 NOTE — Progress Notes (Signed)
Pt verbalizes understanding of d/c instructions, medications, follow up appt, when to seek medical attention, medication instructions and pt belonings policy. Pt was encouraged to check room thoroughly prior to leaving. IV was d/c without complications. Pt has no questions at this time. Pt has voided x2 prior to being d/c. Pt d/c to main entrance via wheelchair accompanied by NT and pts mother who will be driving her home. [name] [name], [name] [name]

## 2014-07-20 NOTE — Progress Notes (Signed)
1 Day Post-Op Procedure(s) (LRB): ROBOTIC ASSISTED TOTAL HYSTERECTOMY WITH ABLATION /EXCISION OF ENDOMETRIOSIS, BILATERAL SALPINGECTOMY (Bilateral)  Subjective: Patient reports nausea, incisional pain, tolerating PO, + flatus and no problems voiding.    Objective: Patient Vitals for the past 24 hrs:  BP Temp Temp src Pulse Resp SpO2 Height Weight  07/20/14 0543 (!) 93/48 mmHg 97.4 F (36.3 C) Oral (!) 56 15 100 % - -  07/20/14 0535 - - - - 15 100 % - -  07/20/14 0138 (!) 89/45 mmHg 98.7 F (37.1 C) Oral (!) 56 14 100 % - -  07/19/14 2208 (!) 86/54 mmHg 98.9 F (37.2 C) Oral 74 14 99 % - -  07/19/14 2118 (!) 92/52 mmHg - - 71 13 99 % - -  07/19/14 2010 106/65 mmHg - - 89 12 100 % 5\' 5"  (1.651 m) 62.143 kg (137 lb)  07/19/14 1916 90/70 mmHg 97.7 F (36.5 C) Oral 87 14 100 % - -  07/19/14 1811 99/67 mmHg 98.1 F (36.7 C) - 86 18 99 % - -  07/19/14 1730 (!) 100/57 mmHg - - 73 18 100 % - -  07/19/14 1715 105/63 mmHg - - 78 14 100 % - -  07/19/14 1700 (!) 109/58 mmHg - - 67 10 100 % - -  07/19/14 1645 (!) 94/50 mmHg - - 75 16 100 % - -  07/19/14 1630 (!) 97/51 mmHg 98.2 F (36.8 C) - 74 14 100 % - -  07/19/14 1247 105/67 mmHg 98.1 F (36.7 C) Oral 69 18 100 % - -   CBC    Component Value Date/Time   WBC 7.1 07/20/2014 0528   RBC 3.28* 07/20/2014 0528   HGB 10.2* 07/20/2014 0528   HCT 29.7* 07/20/2014 0528   PLT 180 07/20/2014 0528   MCV 90.5 07/20/2014 0528   MCH 31.1 07/20/2014 0528   MCHC 34.3 07/20/2014 0528   RDW 13.3 07/20/2014 0528   LYMPHSABS 1.0 11/24/2008 1140   MONOABS 0.3 11/24/2008 1140   EOSABS 0.0 11/24/2008 1140   BASOSABS 0.0 11/24/2008 1140     I have reviewed patient's vital signs, intake and output, medications and labs.  General: alert, cooperative and appears stated age Resp: clear to auscultation bilaterally and normal percussion bilaterally Cardio: regular rate and rhythm, S1, S2 normal, no murmur, click, rub or gallop and normal apical  impulse GI: soft, non-tender; bowel sounds normal; no masses,  no organomegaly and incision: clean, dry and intact Extremities: extremities normal, atraumatic, no cyanosis or edema and Homans sign is negative, no sign of DVT Vaginal Bleeding: minimal  Assessment: s/p Procedure(s): ROBOTIC ASSISTED TOTAL HYSTERECTOMY WITH ABLATION /EXCISION OF ENDOMETRIOSIS, BILATERAL SALPINGECTOMY (Bilateral): stable, progressing well and tolerating diet  Plan: Advance diet Encourage ambulation Advance to PO medication Discontinue IV fluids Discharge home  LOS: 1 day    Makayli Bracken J 07/20/2014, 8:37 AM

## 2014-07-20 NOTE — Addendum Note (Signed)
Addendum  created 07/20/14 [id number] by Jhonnie GarnerBeth M Laiba Fuerte, CRNA   Modules edited: Notes Section   Notes Section:  File: [id number]

## 2014-07-20 NOTE — Plan of Care (Signed)
Problem: Phase II Progression Outcomes Goal: Foley discontinued Outcome: Completed/Met Date Met:  07/20/14 Goal: Voiding trials/Bladder training within 48 hrs Outcome: Progressing

## 2014-07-20 NOTE — Op Note (Signed)
NAMRutherford Nail:  [name], [name]               ACCOUNT NO.:  [id number]  MEDICAL RECORD NO.:  [id number]  LOCATION:  9318                          FACILITY:  [facility]  PHYSICIAN:  Lenoard Adenichard J. Merrisa Skorupski, M.D.DATE OF BIRTH:  Mar 04, 1976  DATE OF PROCEDURE: DATE OF DISCHARGE:                              OPERATIVE REPORT   PREOPERATIVE DIAGNOSES:  Symptomatic dysmenorrhea, menorrhagia, pelvic pain, history of endometriosis.  POSTOPERATIVE DIAGNOSES:  Symptomatic dysmenorrhea, menorrhagia, pelvic pain, history of endometriosis, pelvic endometriosis, enterocele, pelvic adhesions.  PROCEDURES:  Da Vinci-assisted total laparoscopic hysterectomy, bilateral salpingectomy, light ablation of left ovarian fossa and posterior cul-de-sac endometriosis, left ovarian adhesiolysis, left sigmoid colon adhesiolysis, excision of right endometrioma, McCall culdoplasty.  SURGEON:  Lenoard Adenichard J. Franco Duley, M.D.  ASSISTANT:  Arita Missawson, CNM.  ESTIMATED BLOOD LOSS:  Less than 50 mL.  DRAINS:  Foley.  COUNTS:  Correct.  DISPOSITION:  The patient to recovery in good condition.  SPECIMEN:  Uterus, cervix, tubes and ovarian cyst contents to Pathology.  BRIEF OPERATIVE NOTE:  After being apprised of risks of anesthesia, infection, bleeding, injury to surrounding organs, possible need for repair, delayed versus immediate complications to include bowel and bladder injury with possible need for repair, the patient was brought to the operating room, where she was administered a general anesthetic without complications, prepped and draped in usual sterile fashion and Foley catheter was placed.  A RUMI retractor was placed per vagina after exam under anesthesia revealed a midposition, bulky uterus and no adnexal masses.  At this time, infraumbilical incision was made with a scalpel.  Veress needle was placed with opening pressure of -2.  Four liters of CO2 was insufflated without difficulty.  Trocar was placed atraumatically.   Visualization revealed right ovarian endometrioma, right ovarian adhesions, bilateral normal uterus, left ovarian adhesions, normal tubes, normal anterior cul-de-sac, evidence of adhesions from endometriosis in the posterior cul-de-sac.  At this time, decision was made to proceed robotically, two ports were made on the left side, two ports on the right and deep Trendelenburg position was established and the robot was docked in a standard fashion placing the ProGrasp, PK forceps and Endo Shears, monopolar scissors.  At this time, the uterus was elevated.  The left ureter was identified.  The mesosalpinx and the left tube were cauterized and divided.  The retroperitoneal space was entered.  The ureter was identified peristalsing along the medial leaf.  Adhesiolysis of the left ovary was performed to the left ovarian ligament, had bipolar cauterized in a 2- point method and divided.  The posterior leaf of the broad ligament was developed.  The round ligament was divided on the left.  The bladder flap was developed sharply exposing the RUMI cup.  The left uterine vessels were skeletonized, cauterized and not cut.  At this time, attention was turned to the left side.  Left ovary was freed from dense adhesions to the right ovarian cell after identifying the ureter distal to this point of attachment.  Opening of a right ovarian endometrioma was performed and this cavity was excised and the base was cauterized achieving good hemostasis.  At this time, there were also adhesions of the sigmoid that had been previously lysed  to expose further the left adnexa.  Ablation and excision of endometriosis was also performed in the posterior cul-de-sac.  The left ureter was then further identified. The left ovarian ligament was cauterized by 2-point method and divided. The ureter was seen peristalsing along the medial leaf of the peritoneum.  The round ligament was opened.  The bladder flap was further  developed.  The right uterine artery was skeletonized, cauterized, and divided.  The left uterine artery was then divided as well.  Good hemostasis was achieved.  Bladder flap was dissected sharply off the RUMI cup and circumferentially at the cervicovaginal junction. The specimen was detached and retracted into the vagina.  Further irrigation of the right ovarian endometrioma was performed.  Excision and ablation of the base of the cyst wall had been noted.  The vaginal cuff was closed using a 0 V-Loc suture in a continuous running fashion. A second imbricating layer was placed.  The McCall culdoplasty suture was placed in a standard fashion.  Good hemostasis was noted.  Ureter was appeared peristalsing normally and bilaterally.  The robot was then undocked and laparoscopically, Interceed was placed along the vaginal cuff and the second piece was used to completely wrap the right ovary. Irrigation accomplished.  Ropivacaine was placed.  Good hemostasis was noted.  Robot having been undocked, CO2 released and all instruments were removed under direct visualization.  Closure of incision using 0 Vicryl, 4-0 Vicryl, and Dermabond.  Vaginal exam revealed well- approximated, well-supported vaginal cuff, and urine was copious and clear.  The patient tolerated the procedure well, transferred to recovery in good condition.     Lenoard Adenichard J. Meghan Warshawsky, M.D.     RJT/MEDQ  D:  07/19/2014  T:  07/20/2014  Job:  [id number]

## 2014-07-20 NOTE — Anesthesia Postprocedure Evaluation (Signed)
Anesthesia Post Note  Patient: [name] [name]  Procedure(s) Performed: Procedure(s) (LRB): ROBOTIC ASSISTED TOTAL HYSTERECTOMY WITH ABLATION /EXCISION OF ENDOMETRIOSIS, BILATERAL SALPINGECTOMY (Bilateral)  Anesthesia type: General  Patient location: Mother/Baby  Post pain: Pain level controlled  Post assessment: Post-op Vital signs reviewed  Last Vitals:  Filed Vitals:   07/20/14 0543  BP: 93/48  Pulse: 56  Temp: 36.3 C  Resp: 15    Post vital signs: Reviewed  Level of consciousness: awake and alert   Complications: No apparent anesthesia complications

## 2014-07-20 NOTE — Plan of Care (Signed)
Problem: Phase I Progression Outcomes Goal: Other Phase I Outcomes/Goals Outcome: Completed/Met Date Met:  07/20/14     

## 2015-09-29 ENCOUNTER — Emergency Department: Payer: Managed Care, Other (non HMO)

## 2015-09-29 ENCOUNTER — Encounter: Payer: Self-pay | Admitting: *Deleted

## 2015-09-29 ENCOUNTER — Emergency Department
Admission: EM | Admit: 2015-09-29 | Discharge: 2015-09-29 | Disposition: A | Discharge status: Home / Self Care | Payer: Managed Care, Other (non HMO) | Attending: Emergency Medicine | Admitting: Emergency Medicine

## 2015-09-29 DIAGNOSIS — R109 Unspecified abdominal pain: Secondary | ICD-10-CM

## 2015-09-29 DIAGNOSIS — N3090 Cystitis, unspecified without hematuria: Secondary | ICD-10-CM | POA: Diagnosis not present

## 2015-09-29 LAB — BASIC METABOLIC PANEL
Anion gap: 6 (ref 5–15)
BUN: 18 mg/dL (ref 6–20)
CO2: 29 mmol/L (ref 22–32)
CREATININE: 0.7 mg/dL (ref 0.44–1.00)
Calcium: 9.1 mg/dL (ref 8.9–10.3)
Chloride: 102 mmol/L (ref 101–111)
GFR calc Af Amer: >60 mL/min (ref >60)
GFR calc non Af Amer: >60 mL/min (ref >60)
GLUCOSE: 111 mg/dL — AB (ref 65–99)
Potassium: 3.6 mmol/L (ref 3.5–5.1)
Sodium: 137 mmol/L (ref 135–145)

## 2015-09-29 LAB — URINALYSIS COMPLETE WITH MICROSCOPIC (ARMC ONLY)
BILIRUBIN URINE: NEGATIVE
GLUCOSE, UA: NEGATIVE mg/dL
Hgb urine dipstick: NEGATIVE
NITRITE: NEGATIVE
PH: 7 (ref 5.0–8.0)
Protein, ur: NEGATIVE mg/dL
Specific Gravity, Urine: 1.02 (ref 1.005–1.030)

## 2015-09-29 LAB — CBC
HCT: 40.3 % (ref 35.0–47.0)
HEMOGLOBIN: 13.7 g/dL (ref 12.0–16.0)
MCH: 31.7 pg (ref 26.0–34.0)
MCHC: 33.9 g/dL (ref 32.0–36.0)
MCV: 93.5 fL (ref 80.0–100.0)
PLATELETS: 234 10*3/uL (ref 150–440)
RBC: 4.31 MIL/uL (ref 3.80–5.20)
RDW: 12.7 % (ref 11.5–14.5)
WBC: 11.6 10*3/uL — AB (ref 3.6–11.0)

## 2015-09-29 MED ORDER — OXYCODONE-ACETAMINOPHEN 5-325 MG PO TABS
2.0000 | ORAL_TABLET | Freq: Once | ORAL | Status: AC
  Administered 2015-09-29: 2 via ORAL

## 2015-09-29 MED ORDER — TAMSULOSIN HCL 0.4 MG PO CAPS
ORAL_CAPSULE | ORAL | Status: AC
  Filled 2015-09-29: qty 1

## 2015-09-29 MED ORDER — OXYCODONE-ACETAMINOPHEN 5-325 MG PO TABS
ORAL_TABLET | ORAL | Status: AC
  Filled 2015-09-29: qty 2

## 2015-09-29 MED ORDER — SULFAMETHOXAZOLE-TRIMETHOPRIM 800-160 MG PO TABS: 1.0000 | ORAL_TABLET | Freq: Two times a day (BID) | ORAL | Status: AC

## 2015-09-29 MED ORDER — ONDANSETRON HCL 4 MG PO TABS
ORAL_TABLET | ORAL | Status: AC
  Filled 2015-09-29: qty 1

## 2015-09-29 MED ORDER — TAMSULOSIN HCL 0.4 MG PO CAPS
0.4000 mg | ORAL_CAPSULE | Freq: Once | ORAL | Status: AC
  Administered 2015-09-29: 0.4 mg via ORAL
  Filled 2015-09-29: qty 1

## 2015-09-29 MED ORDER — TAMSULOSIN HCL 0.4 MG PO CAPS: 0.4000 mg | ORAL_CAPSULE | Freq: Every day | ORAL | Status: AC

## 2015-09-29 MED ORDER — OXYCODONE-ACETAMINOPHEN 5-325 MG PO TABS: 2.0000 | ORAL_TABLET | Freq: Four times a day (QID) | ORAL | Status: AC | PRN

## 2015-09-29 MED ORDER — ONDANSETRON HCL 4 MG PO TABS
4.0000 mg | ORAL_TABLET | Freq: Once | ORAL | Status: AC
  Administered 2015-09-29: 4 mg via ORAL
  Filled 2015-09-29: qty 1

## 2015-09-29 NOTE — ED Notes (Signed)
States right sided abd pain that radiates to her right back/ flank area, denies any blood in her urine, states some nausea

## 2015-09-29 NOTE — ED Provider Notes (Signed)
[facility] Emergency Department Provider Note     Time seen: ----------------------------------------- 3:50 PM on 09/29/2015 -----------------------------------------    I have reviewed the triage vital signs and the nursing notes.   HISTORY  Chief Complaint Abdominal Pain and Flank Pain    HPI [name] [name] is a 40 y.o. female who presents ER for right-sided flank pain that radiates into her back. Patient denies any blood in the urine, states she has had several kidney stones in the past, particularly when she was pregnant. She does have some nausea, nothing makes her symptoms better or worse.   Past Medical History  Diagnosis Date  . SVD (spontaneous vaginal delivery)     x 1  . History of kidney stones     passed some and 1 surgery  . Endometriosis     Patient Active Problem List   Diagnosis Date Noted  . Endometriosis 07/19/2014    Past Surgical History  Procedure Laterality Date  . Diagnostic laparoscopy  2009    endometriosis  . Wisdom tooth extraction    . Eye surgery      lasik - bilateral  . Kidney stone removal      kdiney stone  . Robotic assisted total hysterectomy Bilateral 07/19/2014    Procedure: ROBOTIC ASSISTED TOTAL HYSTERECTOMY WITH ABLATION /EXCISION OF ENDOMETRIOSIS, BILATERAL SALPINGECTOMY;  Surgeon: Lenoard Aden, MD;  Location: [facility];  Service: Gynecology;  Laterality: Bilateral;    Allergies Tape  Social History Social History  Substance Use Topics  . Smoking status: Never Smoker   . Smokeless tobacco: Never Used  . Alcohol Use: Yes     Comment: occasional    Review of Systems Constitutional: Negative for fever. Eyes: Negative for visual changes. ENT: Negative for sore throat. Cardiovascular: Negative for chest pain. Respiratory: Negative for shortness of breath. Gastrointestinal:   positive for right flank pain, nausea  Genitourinary: Negative for dysuria. Musculoskeletal: Negative for back  pain. Skin: Negative for rash. Neurological: Negative for headaches, focal weakness or numbness.  10-point ROS otherwise negative.  ____________________________________________   PHYSICAL EXAM:  VITAL SIGNS: ED Triage Vitals  Enc Vitals Group     BP 09/29/15 1253 103/65 mmHg     Pulse Rate 09/29/15 1253 64     Resp 09/29/15 1253 18     Temp 09/29/15 1253 97.6 F (36.4 C)     Temp src --      SpO2 09/29/15 1253 100 %     Weight 09/29/15 1253 142 lb (64.411 kg)     Height 09/29/15 1253  (1.676 m)     Head Cir --      Peak Flow --      Pain Score 09/29/15 1301 7     Pain Loc --      Pain Edu? --      Excl. in GC? --     Constitutional: Alert and oriented. Well appearing and in no distress. Eyes: Conjunctivae are normal. PERRL. Normal extraocular movements. ENT   Head: Normocephalic and atraumatic.   Nose: No congestion/rhinnorhea.   Mouth/Throat: Mucous membranes are moist.   Neck: No stridor. Cardiovascular: Normal rate, regular rhythm. Normal and symmetric distal pulses are present in all extremities. No murmurs, rubs, or gallops. Respiratory: Normal respiratory effort without tachypnea nor retractions. Breath sounds are clear and equal bilaterally. No wheezes/rales/rhonchi. Gastrointestinal: Soft and nontender. No distention. No abdominal bruits.  Musculoskeletal: Nontender with normal range of motion in all extremities. No joint  effusions.  No lower extremity tenderness nor edema. Neurologic:  Normal speech and language. No gross focal neurologic deficits are appreciated. Speech is normal. No gait instability. Skin:  Skin is warm, dry and intact. No rash noted. Psychiatric: Mood and affect are normal. Speech and behavior are normal. Patient exhibits appropriate insight and judgment. ____________________________________________  ED COURSE:  Pertinent labs & imaging results that were available during my care of the patient were reviewed by me and  considered in my medical decision making (see chart for details).  patient is no acute distress, will check basic labs and reevaluate. ____________________________________________    LABS (pertinent positives/negatives)  Labs Reviewed  CBC - Abnormal; Notable for the following:    WBC 11.6 (*)    All other components within normal limits  URINALYSIS COMPLETEWITH MICROSCOPIC ([facility] ONLY) - Abnormal; Notable for the following:    Color, Urine YELLOW (*)    APPearance HAZY (*)    Ketones, ur 2+ (*)    Leukocytes, UA TRACE (*)    Bacteria, UA RARE (*)    Squamous Epithelial / LPF 6-30 (*)    All other components within normal limits  BASIC METABOLIC PANEL - Abnormal; Notable for the following:    Glucose, Bld 111 (*)    All other components within normal limits    RADIOLOGY  KUB IMPRESSION: 1. Nonobstructing stones in the left kidney. 2. Moderate stool in the colon without obstruction. ____________________________________________  FINAL ASSESSMENT AND PLAN   Possible renal colic, cystitis  Plan: Patient with labs and imaging as dictated above.  I'm unclear if this is renal colic or not. Patient looks well and is in no acute distress. We'll place her on Septra, give pain medication and Flomax. She is stable for outpatient follow-up with her doctor.   Emily Filbert, MD   Emily Filbert, MD 09/29/15 1600

## 2015-09-29 NOTE — Discharge Instructions (Signed)
Flank Pain °Flank pain refers to pain that is located on the side of the body between the upper abdomen and the back. The pain may occur over a short period of time (acute) or may be long-term or reoccurring (chronic). It may be mild or severe. Flank pain can be caused by many things. °CAUSES  °Some of the more common causes of flank pain include: °· Muscle strains.   °· Muscle spasms.   °· A disease of your spine (vertebral disk disease).   °· A lung infection (pneumonia).   °· Fluid around your lungs (pulmonary edema).   °· A kidney infection.   °· Kidney stones.   °· A very painful skin rash caused by the chickenpox virus (shingles).   °· Gallbladder disease.   °HOME CARE INSTRUCTIONS  °Home care will depend on the cause of your pain. In general, °· Rest as directed by your caregiver. °· Drink enough fluids to keep your urine clear or pale yellow. °· Only take over-the-counter or prescription medicines as directed by your caregiver. Some medicines may help relieve the pain. °· Tell your caregiver about any changes in your pain. °· Follow up with your caregiver as directed. °SEEK IMMEDIATE MEDICAL CARE IF:  °· Your pain is not controlled with medicine.   °· You have new or worsening symptoms. °· Your pain increases.   °· You have abdominal pain.   °· You have shortness of breath.   °· You have persistent nausea or vomiting.   °· You have swelling in your abdomen.   °· You feel faint or pass out.   °· You have blood in your urine. °· You have a fever or persistent symptoms for more than 2-3 days. °· You have a fever and your symptoms suddenly get worse. °MAKE SURE YOU:  °· Understand these instructions. °· Will watch your condition. °· Will get help right away if you are not doing well or get worse. °  °This information is not intended to replace advice given to you by your health care provider. Make sure you discuss any questions you have with your health care provider. °  °Document Released: 10/04/2005 Document  Revised: 05/07/2012 Document Reviewed: 03/27/2012 °Elsevier Interactive Patient Education ©2016 [employer] ° °Urinary Tract Infection °A urinary tract infection (UTI) can occur any place along the urinary tract. The tract includes the kidneys, ureters, bladder, and urethra. A type of germ called bacteria often causes a UTI. UTIs are often helped with antibiotic medicine.  °HOME CARE  °· If given, take antibiotics as told by your doctor. Finish them even if you start to feel better. °· Drink enough fluids to keep your pee (urine) clear or pale yellow. °· Avoid tea, drinks with caffeine, and bubbly (carbonated) drinks. °· Pee often. Avoid holding your pee in for a long time. °· Pee before and after having sex (intercourse). °· Wipe from front to back after you poop (bowel movement) if you are a woman. Use each tissue only once. °GET HELP RIGHT AWAY IF:  °· You have back pain. °· You have lower belly (abdominal) pain. °· You have chills. °· You feel sick to your stomach (nauseous). °· You throw up (vomit). °· Your burning or discomfort with peeing does not go away. °· You have a fever. °· Your symptoms are not better in 3 days. °MAKE SURE YOU:  °· Understand these instructions. °· Will watch your condition. °· Will get help right away if you are not doing well or get worse. °  °This information is not intended to replace advice given to   you by your health care provider. Make sure you discuss any questions you have with your health care provider. °  °Document Released: 01/30/2008 Document Revised: 09/03/2014 Document Reviewed: 03/13/2012 °Elsevier Interactive Patient Education ©2016 [employer] Inc. ° °

## 2017-09-27 ENCOUNTER — Encounter: Payer: Self-pay | Admitting: Emergency Medicine

## 2017-09-27 ENCOUNTER — Other Ambulatory Visit: Payer: Self-pay

## 2017-09-27 ENCOUNTER — Emergency Department
Admission: EM | Admit: 2017-09-27 | Discharge: 2017-09-27 | Disposition: A | Discharge status: Home / Self Care | Payer: Commercial Managed Care - PPO | Attending: Emergency Medicine | Admitting: Emergency Medicine

## 2017-09-27 ENCOUNTER — Emergency Department: Payer: Commercial Managed Care - PPO

## 2017-09-27 DIAGNOSIS — Z79899 Other long term (current) drug therapy: Secondary | ICD-10-CM | POA: Diagnosis not present

## 2017-09-27 DIAGNOSIS — R1031 Right lower quadrant pain: Secondary | ICD-10-CM | POA: Diagnosis present

## 2017-09-27 DIAGNOSIS — K5900 Constipation, unspecified: Secondary | ICD-10-CM | POA: Diagnosis not present

## 2017-09-27 DIAGNOSIS — R109 Unspecified abdominal pain: Secondary | ICD-10-CM

## 2017-09-27 LAB — COMPREHENSIVE METABOLIC PANEL
ALT: 10 U/L — ABNORMAL LOW (ref 14–54)
AST: 16 U/L (ref 15–41)
Albumin: 4.1 g/dL (ref 3.5–5.0)
Alkaline Phosphatase: 53 U/L (ref 38–126)
Anion gap: 10 (ref 5–15)
BUN: 14 mg/dL (ref 6–20)
CO2: 27 mmol/L (ref 22–32)
Calcium: 9.3 mg/dL (ref 8.9–10.3)
Chloride: 101 mmol/L (ref 101–111)
Creatinine, Ser: 0.75 mg/dL (ref 0.44–1.00)
GFR calc non Af Amer: >60 mL/min (ref >60)
Glucose, Bld: 101 mg/dL — ABNORMAL HIGH (ref 65–99)
POTASSIUM: 3.4 mmol/L — AB (ref 3.5–5.1)
SODIUM: 138 mmol/L (ref 135–145)
Total Bilirubin: 1.1 mg/dL (ref 0.3–1.2)
Total Protein: 7.1 g/dL (ref 6.5–8.1)

## 2017-09-27 LAB — LIPASE, BLOOD: Lipase: 23 U/L (ref 11–51)

## 2017-09-27 LAB — URINALYSIS, COMPLETE (UACMP) WITH MICROSCOPIC
BILIRUBIN URINE: NEGATIVE
Glucose, UA: NEGATIVE mg/dL
Hgb urine dipstick: NEGATIVE
KETONES UR: NEGATIVE mg/dL
Leukocytes, UA: NEGATIVE
Nitrite: NEGATIVE
PH: 6 (ref 5.0–8.0)
Protein, ur: NEGATIVE mg/dL
Specific Gravity, Urine: 1.015 (ref 1.005–1.030)

## 2017-09-27 LAB — CBC
HCT: 39.8 % (ref 35.0–47.0)
HEMOGLOBIN: 13.5 g/dL (ref 12.0–16.0)
MCH: 31.9 pg (ref 26.0–34.0)
MCHC: 34 g/dL (ref 32.0–36.0)
MCV: 93.9 fL (ref 80.0–100.0)
Platelets: 242 10*3/uL (ref 150–440)
RBC: 4.24 MIL/uL (ref 3.80–5.20)
RDW: 12.5 % (ref 11.5–14.5)
WBC: 6.4 10*3/uL (ref 3.6–11.0)

## 2017-09-27 MED ORDER — MAGNESIUM CITRATE PO SOLN: 1.0000 | Freq: Once | ORAL | 1 refills | Status: AC

## 2017-09-27 MED ORDER — MAGNESIUM CITRATE PO SOLN: 1.0000 | Freq: Once | ORAL | Status: DC

## 2017-09-27 MED ORDER — LACTULOSE 10 G PO PACK: 10.0000 g | PACK | Freq: Two times a day (BID) | ORAL | 0 refills | Status: AC

## 2017-09-27 MED ORDER — IOPAMIDOL (ISOVUE-300) INJECTION 61%
100.0000 mL | Freq: Once | INTRAVENOUS | Status: AC | PRN
  Administered 2017-09-27: 100 mL via INTRAVENOUS
  Filled 2017-09-27: qty 100

## 2017-09-27 MED ORDER — IOPAMIDOL (ISOVUE-300) INJECTION 61%
30.0000 mL | Freq: Once | INTRAVENOUS | Status: AC | PRN
  Administered 2017-09-27: 30 mL via ORAL
  Filled 2017-09-27: qty 30

## 2017-09-27 NOTE — ED Triage Notes (Signed)
Says seen a t walk in for left low abd pain last week.  Was told she was constipated and taking mirilax.  Was doing better since Monday, but today statred having right lower quad abd pain.

## 2017-09-27 NOTE — ED Notes (Signed)
Pt finished with PO contrast- CT notified.

## 2017-09-27 NOTE — ED Notes (Signed)
Pt presents today with RLQ pain. Pt states she was at walk in clinic on Saturday and they stated she was constipated and so Pt took Miralax and now pain in the RLQ.

## 2017-09-27 NOTE — ED Provider Notes (Signed)
[facility] Emergency Department Provider Note   ____________________________________________   I have reviewed the triage vital signs and the nursing notes.   HISTORY  Chief Complaint Abdominal Pain   History limited by: Not Limited   HPI [name] [name] is a 42 y.o. female who presents to the emergency department today because of concerns for abdominal pain.  She states pain is located in the right lower quadrant.  This pain started today.  Today's abdominal pain is somewhat crampy in nature. Patient had been having some left-sided abdominal pain earlier in the week although stated that did seem to get better.   She has had some associated constipation. Went to see PCP who started her on miralax. She denies any associated fevers.  Denies similar pain in the past.    Per medical record review patient has a history of kidney stones  Past Medical History:  Diagnosis Date  . Endometriosis   . History of kidney stones    passed some and 1 surgery  . SVD (spontaneous vaginal delivery)    x 1    Patient Active Problem List   Diagnosis Date Noted  . Endometriosis 07/19/2014    Past Surgical History:  Procedure Laterality Date  . DIAGNOSTIC LAPAROSCOPY  2009   endometriosis  . EYE SURGERY     lasik - bilateral  . kidney stone removal     kdiney stone  . ROBOTIC ASSISTED TOTAL HYSTERECTOMY Bilateral 07/19/2014   Procedure: ROBOTIC ASSISTED TOTAL HYSTERECTOMY WITH ABLATION /EXCISION OF ENDOMETRIOSIS, BILATERAL SALPINGECTOMY;  Surgeon: Lenoard Adenichard J Taavon, MD;  Location: [facility];  Service: Gynecology;  Laterality: Bilateral;  . WISDOM TOOTH EXTRACTION      Prior to Admission medications   Medication Sig Start Date End Date Taking? Authorizing Provider  oxyCODONE-acetaminophen (PERCOCET) 5-325 MG tablet Take 2 tablets by mouth every 6 (six) hours as needed for moderate pain or severe pain. 09/29/15   Emily FilbertWilliams, Jonathan E, MD  oxyCODONE-acetaminophen  (PERCOCET/ROXICET) 5-325 MG per tablet Take 1-2 tablets by mouth every 3 (three) hours as needed for moderate pain. 07/20/14   Olivia Mackieaavon, Richard, MD  Probiotic Product (PROBIOTIC DAILY PO) Take 1 capsule by mouth daily.    [provider]  sulfamethoxazole-trimethoprim (BACTRIM DS) 800-160 MG tablet Take 1 tablet by mouth 2 (two) times daily. 09/29/15   Emily FilbertWilliams, Jonathan E, MD  sulfamethoxazole-trimethoprim (BACTRIM DS) 800-160 MG tablet Take 1 tablet by mouth 2 (two) times daily. 09/29/15   Emily FilbertWilliams, Jonathan E, MD  tamsulosin (FLOMAX) 0.4 MG CAPS capsule Take 1 capsule (0.4 mg total) by mouth daily after breakfast. 09/29/15   Emily FilbertWilliams, Jonathan E, MD  traMADol (ULTRAM) 50 MG tablet Take 1 tablet (50 mg total) by mouth every 6 (six) hours as needed for moderate pain. 07/20/14   Olivia Mackieaavon, Richard, MD    Allergies Tape  No family history on file.  Social History Social History   Tobacco Use  . Smoking status: Never Smoker  . Smokeless tobacco: Never Used  Substance Use Topics  . Alcohol use: Yes    Comment: occasional  . Drug use: No    Review of Systems Constitutional: No fever/chills Eyes: No visual changes. ENT: No sore throat. Cardiovascular: Denies chest pain. Respiratory: Denies shortness of breath. Gastrointestinal: No abdominal pain.  No nausea, no vomiting.  No diarrhea.   Genitourinary: Negative for dysuria. Musculoskeletal: Negative for back pain. Skin: Negative for rash. Neurological: Negative for headaches, focal weakness or numbness.  ____________________________________________   PHYSICAL  EXAM:  VITAL SIGNS: ED Triage Vitals  Enc Vitals Group     BP 09/27/17 1455 135/85     Pulse Rate 09/27/17 1455 70     Resp 09/27/17 1455 16     Temp 09/27/17 1455 97.8 F (36.6 C)     Temp Source 09/27/17 1455 Oral     SpO2 09/27/17 1455 100 %     Weight 09/27/17 1456 144 lb (65.3 kg)     Height 09/27/17 1456 5\' 6"  (1.676 m)     Head Circumference --      Peak  Flow --      Pain Score 09/27/17 1456 8     Pain Loc --      Pain Edu? --      Excl. in GC? --      Constitutional: Alert and oriented. Well appearing and in no distress. Eyes: Conjunctivae are normal.  ENT   Head: Normocephalic and atraumatic.   Nose: No congestion/rhinnorhea.   Mouth/Throat: Mucous membranes are moist.   Neck: No stridor. Hematological/Lymphatic/Immunilogical: No cervical lymphadenopathy. Cardiovascular: Normal rate, regular rhythm.  No murmurs, rubs, or gallops.  Respiratory: Normal respiratory effort without tachypnea nor retractions. Breath sounds are clear and equal bilaterally. No wheezes/rales/rhonchi. Gastrointestinal: Soft and slightly tender in the lower abdomen, worse on the right. No rebound. No guarding.  Genitourinary: Deferred Musculoskeletal: Normal range of motion in all extremities. No lower extremity edema. Neurologic:  Normal speech and language. No gross focal neurologic deficits are appreciated.  Skin:  Skin is warm, dry and intact. No rash noted. Psychiatric: Mood and affect are normal. Speech and behavior are normal. Patient exhibits appropriate insight and judgment.  ____________________________________________    LABS (pertinent positives/negatives)  UA not consistent with infection CMP k 3.4, cr 0.75 CBC wnl Lipase 23  ____________________________________________   EKG  None  ____________________________________________    RADIOLOGY  CT abd/pel Large amount of stool  ____________________________________________   PROCEDURES  Procedures  ____________________________________________   INITIAL IMPRESSION / ASSESSMENT AND PLAN / ED COURSE  Pertinent labs & imaging results that were available during my care of the patient were reviewed by me and considered in my medical decision making (see chart for details).  Patient presented to the emergency department today because of concerns for abdominal pain.   Pain was in the right lower quadrant.  I did discussion with the patient about CT scan.  At this point I think less likely appendicitis given lack of fever or white count however cannot completely rule out.  Patient did want to undergo CT scan. Did discuss radiation/cancer risk. CT scan did show large amount of stool but no appendicitis. Discussed findings with patient. Will discharge with magnesium citrate and lactulose. She is already on miralax.     ____________________________________________   FINAL CLINICAL IMPRESSION(S) / ED DIAGNOSES  Final diagnoses:  Abdominal pain, unspecified abdominal location  Constipation, unspecified constipation type     Note: This dictation was prepared with Dragon dictation. Any transcriptional errors that result from this process are unintentional     Phineas Semen, MD 09/27/17 2016

## 2017-09-27 NOTE — Discharge Instructions (Signed)
Please seek medical attention for any high fevers, chest pain, shortness of breath, change in behavior, persistent vomiting, bloody stool or any other new or concerning symptoms.  

## 2018-07-14 IMAGING — CT CT ABD-PELV W/ CM
2 of 5 series · 16 of 46 positions shown, 18 images · IV contrast (APPLIED)
Comparison: None.

CLINICAL DATA: Constipation, with RIGHT lower abdominal pain
developing after taking a laxative.

EXAM:
CT ABDOMEN AND PELVIS WITH CONTRAST
TECHNIQUE: Multidetector CT imaging of the abdomen and pelvis was performed
using the standard protocol following bolus administration of
intravenous contrast.
CONTRAST:  100mL [id number] IOPAMIDOL ([id number]) INJECTION 61%

[Series 2: axial st · axial · 0.83mm/px · z∈[-887,-507]mm · 13 of 86 slices shown, 15 images]
[im 5/86  soft-tissue]
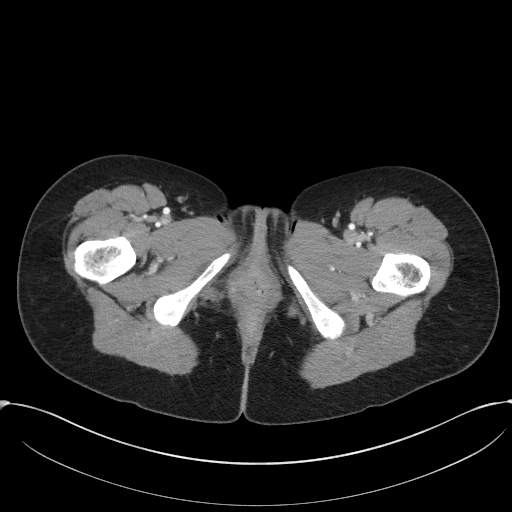
[im 5/86  bone]
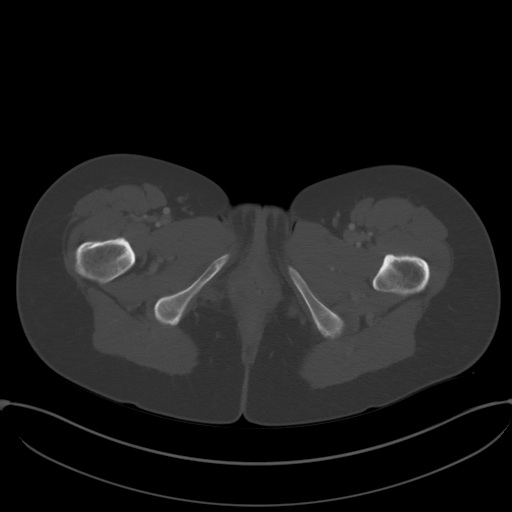
[im 14/86  soft-tissue]
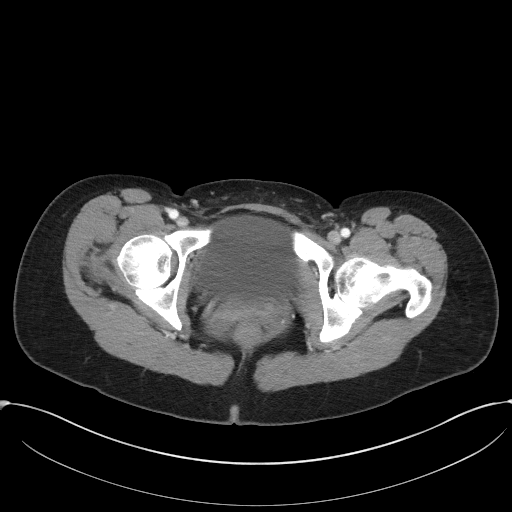
[im 18/86  soft-tissue]
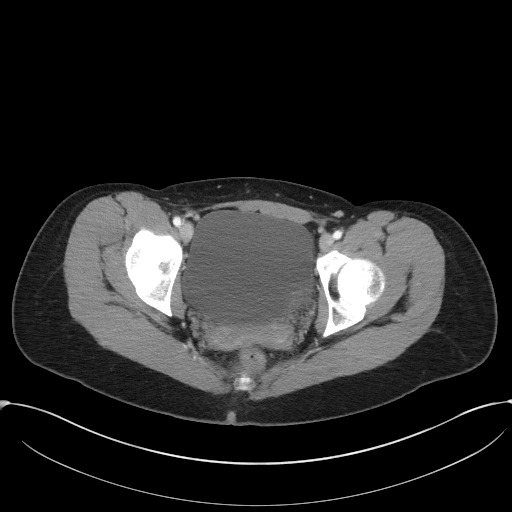
[im 23/86  soft-tissue]
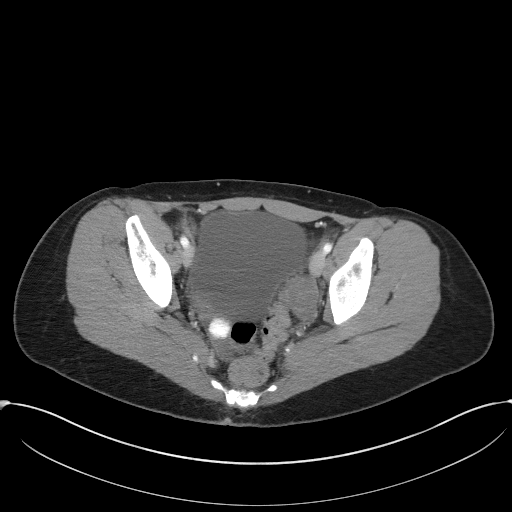
[im 32/86  soft-tissue]
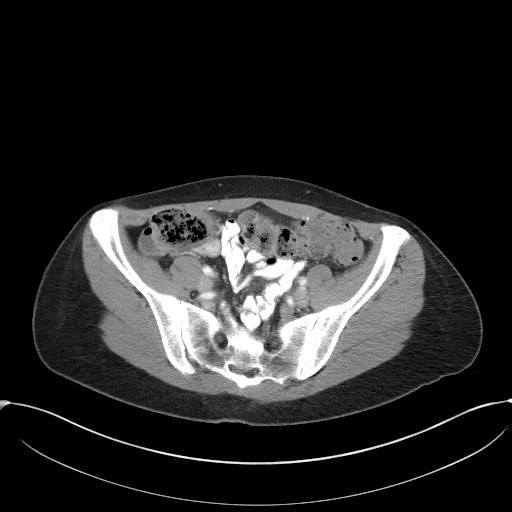
[im 36/86  soft-tissue]
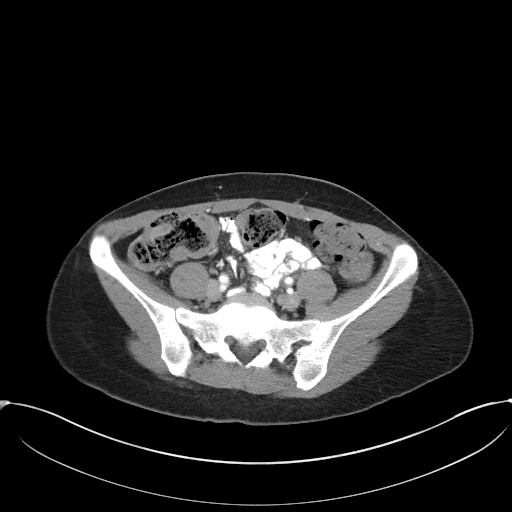
[im 45/86  soft-tissue]
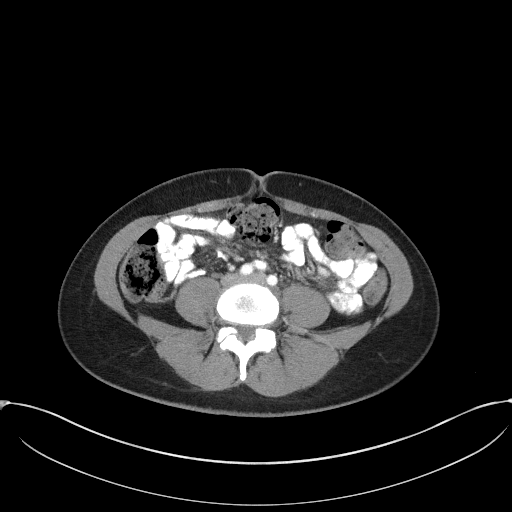
[im 50/86  soft-tissue]
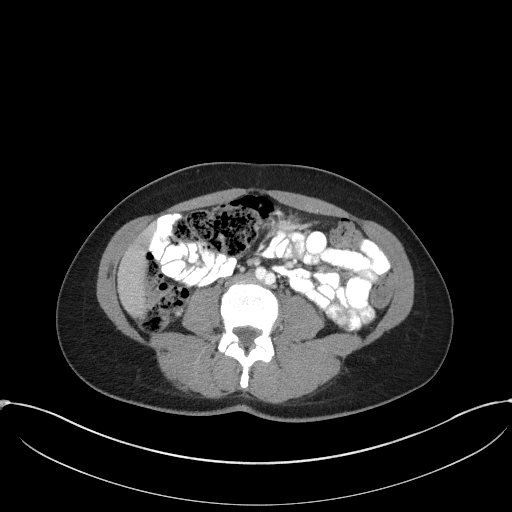
[im 54/86  soft-tissue]
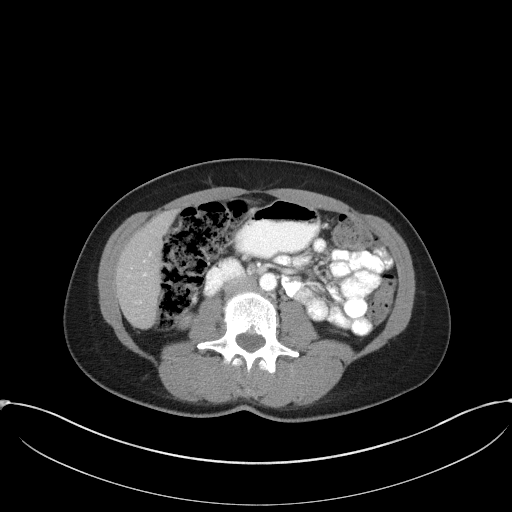
[im 54/86  bone]
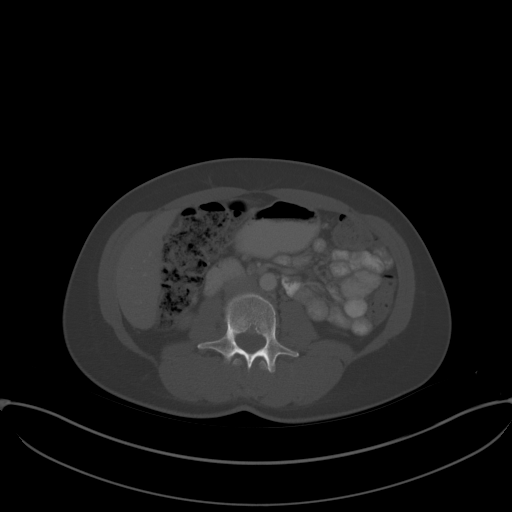
[im 63/86  soft-tissue]
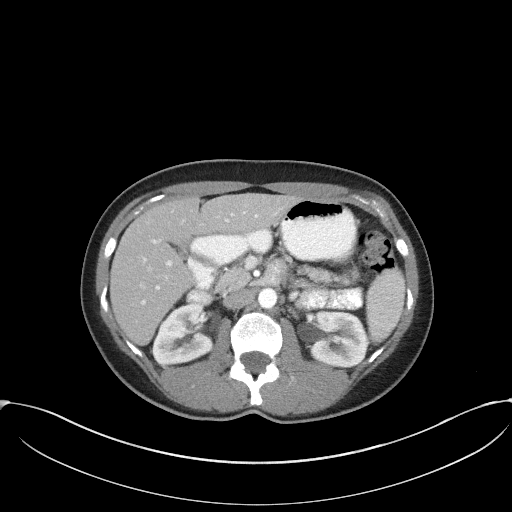
[im 68/86  soft-tissue]
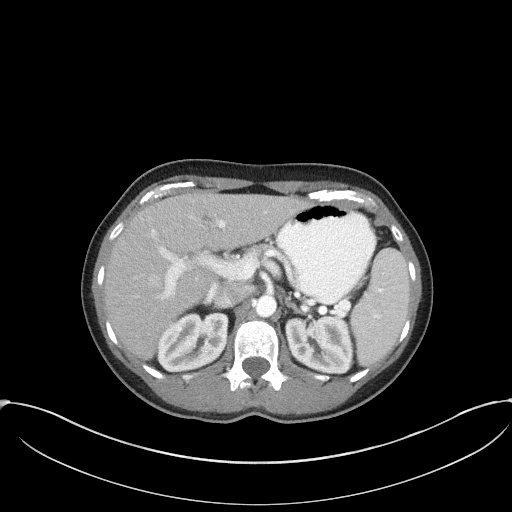
[im 72/86  soft-tissue]
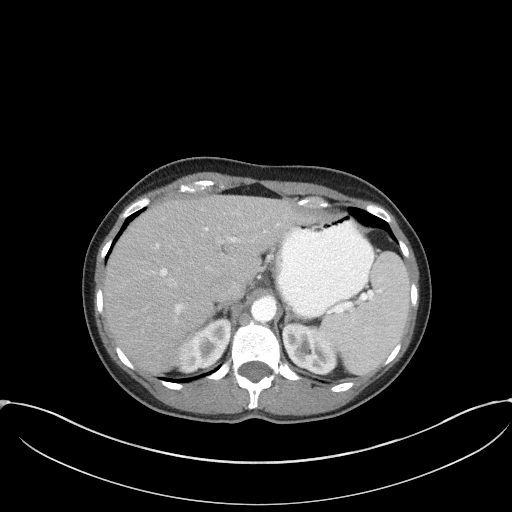
[im 81/86  soft-tissue]
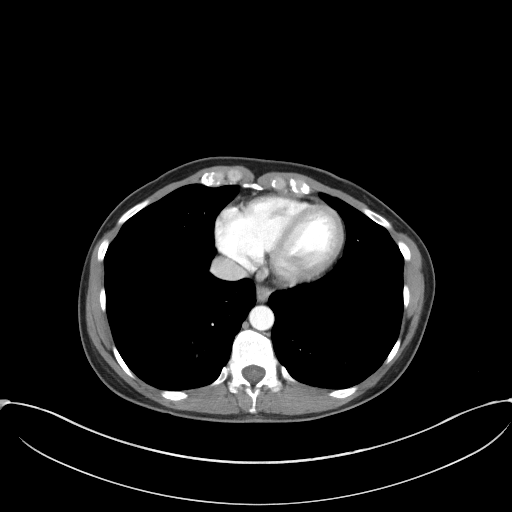

[Series 5: coronal st · coronal · 0.68mm/px · 3 of 74 slices shown]
[im 25/74  soft-tissue]
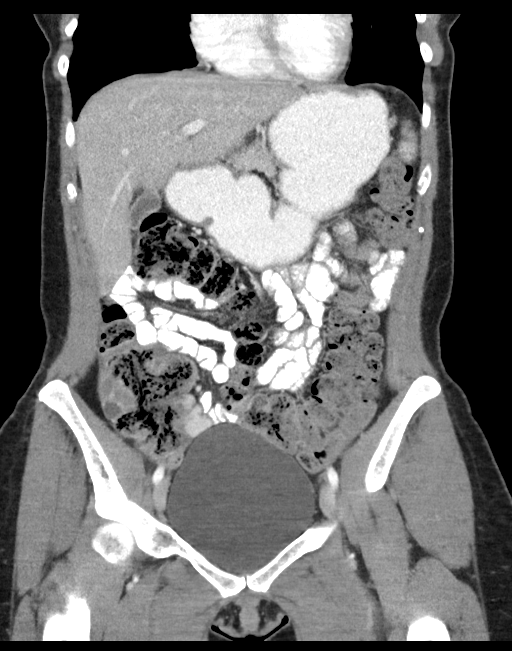
[im 33/74  soft-tissue]
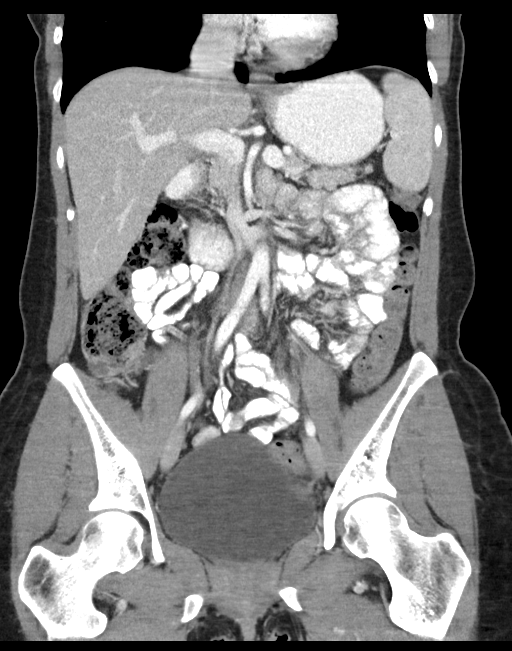
[im 41/74  soft-tissue]
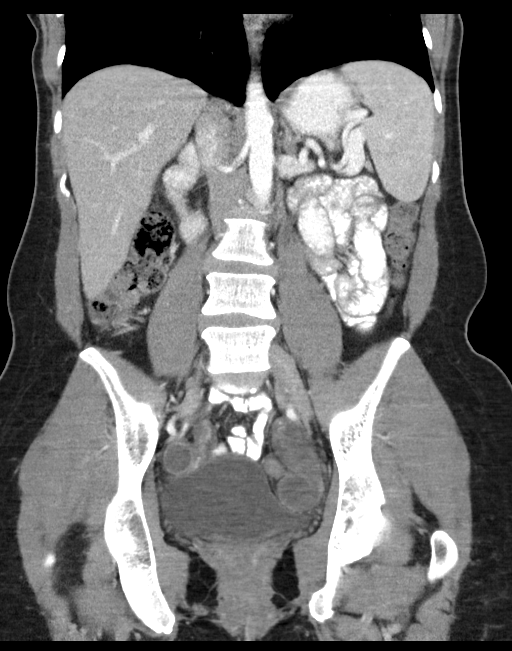

[16 of 46 positions shown; findings below may reference images not displayed]

FINDINGS: Lower chest: No acute abnormality.

Hepatobiliary: No focal liver abnormality is seen. No gallstones,
gallbladder wall thickening, or biliary dilatation.

Pancreas: Unremarkable. No pancreatic ductal dilatation or
surrounding inflammatory changes.

Spleen: Normal in size without focal abnormality.

Adrenals/Urinary Tract: Adrenal glands are unremarkable. Kidneys are
normal, without renal calculi, focal lesion, or hydronephrosis.
Bladder is unremarkable.

Stomach/Bowel: Stomach appears within normal limits. There is no
visible appendiceal inflammation. There is no small bowel
obstruction. The colon is markedly distended with stool throughout.
There is no fecal impaction.

Vascular/Lymphatic: No significant vascular findings are present. No
enlarged abdominal or pelvic lymph nodes.

Reproductive: Status post hysterectomy. No adnexal masses. BILATERAL
ovarian cysts, greater on the LEFT.

Other: No abdominal wall hernia or abnormality. No abdominopelvic
ascites.

Musculoskeletal: No acute or significant osseous findings.
IMPRESSION: Extensive constipation. Colon is markedly distended with stool
throughout its course but there is no fecal impaction.

No RIGHT lower quadrant inflammation is evident although the
appendix cannot be discretely identified.

Status post hysterectomy. BILATERAL ovarian cysts, greater on the
LEFT. No visible free fluid
